# Patient Record
Sex: Female | Born: 1995 | Race: Black or African American | Hispanic: No | Marital: Single | Smoking: Never smoker
Health system: Southern US, Community
[De-identification: ages and names within clinical notes are randomized; demographics above are authoritative.]

---
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
## 2022-11-03 ENCOUNTER — Other Ambulatory Visit: Payer: Self-pay

## 2022-11-03 ENCOUNTER — Emergency Department (HOSPITAL_COMMUNITY)
Admission: EM | Admit: 2022-11-03 | Discharge: 2022-11-03 | Disposition: A | Discharge status: Home / Self Care | Payer: Self-pay

## 2022-11-03 ENCOUNTER — Encounter (HOSPITAL_COMMUNITY): Payer: Self-pay

## 2022-11-03 DIAGNOSIS — M25532 Pain in left wrist: Secondary | ICD-10-CM | POA: Diagnosis present
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
## 2022-11-03 NOTE — Discharge Instructions (Signed)
You have been seen today for your complaint of left wrist pain. Follow up with: Dr. Izora Ribas.  He is a Hydrographic surveyor.  Call to schedule a follow-up appointment. Please seek immediate medical care if you develop any of the following symptoms: You lose feeling in your fingers or hand. Your fingers turn white, very red, or cold and blue. You cannot move your fingers. At this time there does not appear to be the presence of an emergent medical condition, however there is always the potential for conditions to change. Please read and follow the below instructions.  Do not take your medicine if  develop an itchy rash, swelling in your mouth or lips, or difficulty breathing; call 911 and seek immediate emergency medical attention if this occurs.  You may review your lab tests and imaging results in their entirety on your MyChart account.  Please discuss all results of fully with your primary care provider and other specialist at your follow-up visit.  Note: Portions of this text may have been transcribed using voice recognition software. Every effort was made to ensure accuracy; however, inadvertent computerized transcription errors may still be present.

## 2022-12-25 ENCOUNTER — Emergency Department (HOSPITAL_BASED_OUTPATIENT_CLINIC_OR_DEPARTMENT_OTHER)
Admission: EM | Admit: 2022-12-25 | Discharge: 2022-12-26 | Disposition: A | Discharge status: Home / Self Care | Payer: Self-pay | Attending: Emergency Medicine | Admitting: Emergency Medicine

## 2022-12-25 ENCOUNTER — Encounter (HOSPITAL_BASED_OUTPATIENT_CLINIC_OR_DEPARTMENT_OTHER): Payer: Self-pay

## 2022-12-25 ENCOUNTER — Other Ambulatory Visit: Payer: Self-pay

## 2022-12-25 DIAGNOSIS — R1084 Generalized abdominal pain: Secondary | ICD-10-CM | POA: Insufficient documentation

## 2022-12-25 DIAGNOSIS — R109 Unspecified abdominal pain: Secondary | ICD-10-CM

## 2022-12-25 DIAGNOSIS — R112 Nausea with vomiting, unspecified: Secondary | ICD-10-CM | POA: Insufficient documentation

## 2022-12-25 DIAGNOSIS — R519 Headache, unspecified: Secondary | ICD-10-CM | POA: Insufficient documentation

## 2022-12-25 LAB — CBC
HCT: 37.8 % (ref 36.0–46.0)
Hemoglobin: 12.2 g/dL (ref 12.0–15.0)
MCH: 26.9 pg (ref 26.0–34.0)
MCHC: 32.3 g/dL (ref 30.0–36.0)
MCV: 83.3 fL (ref 80.0–100.0)
Platelets: 237 10*3/uL (ref 150–400)
RBC: 4.54 MIL/uL (ref 3.87–5.11)
RDW: 14.7 % (ref 11.5–15.5)
WBC: 5.2 10*3/uL (ref 4.0–10.5)
nRBC: 0 % (ref 0.0–0.2)

## 2022-12-25 LAB — URINALYSIS, ROUTINE W REFLEX MICROSCOPIC
Bilirubin Urine: NEGATIVE
Glucose, UA: NEGATIVE mg/dL
Hgb urine dipstick: NEGATIVE
Ketones, ur: NEGATIVE mg/dL
Leukocytes,Ua: NEGATIVE
Nitrite: NEGATIVE
Specific Gravity, Urine: 1.021 (ref 1.005–1.030)
pH: 8.5 — ABNORMAL HIGH (ref 5.0–8.0)

## 2022-12-25 LAB — COMPREHENSIVE METABOLIC PANEL
ALT: 7 U/L (ref 0–44)
AST: 11 U/L — ABNORMAL LOW (ref 15–41)
Albumin: 4.4 g/dL (ref 3.5–5.0)
Alkaline Phosphatase: 41 U/L (ref 38–126)
Anion gap: 8 (ref 5–15)
BUN: 9 mg/dL (ref 6–20)
CO2: 25 mmol/L (ref 22–32)
Calcium: 9.4 mg/dL (ref 8.9–10.3)
Chloride: 105 mmol/L (ref 98–111)
Creatinine, Ser: 0.6 mg/dL (ref 0.44–1.00)
GFR, Estimated: >60 mL/min (ref >60)
Glucose, Bld: 99 mg/dL (ref 70–99)
Potassium: 4 mmol/L (ref 3.5–5.1)
Sodium: 138 mmol/L (ref 135–145)
Total Bilirubin: 0.4 mg/dL (ref <1.2)
Total Protein: 7.8 g/dL (ref 6.5–8.1)

## 2022-12-25 LAB — PREGNANCY, URINE: Preg Test, Ur: NEGATIVE

## 2022-12-25 LAB — LIPASE, BLOOD: Lipase: 15 U/L (ref 11–51)

## 2022-12-25 MED ORDER — KETOROLAC TROMETHAMINE 60 MG/2ML IM SOLN
60.0000 mg | Freq: Once | INTRAMUSCULAR | Status: AC
  Administered 2022-12-25: 60 mg via INTRAMUSCULAR
  Filled 2022-12-25: qty 2

## 2022-12-25 NOTE — ED Provider Notes (Signed)
[place] [facility]ER[facility]ENC[facility] DEPARTMENT AT [facility] Provider Note   CSN: [id number] Arrival date & time: 12/25/22  1953     History  Chief Complaint  Patient presents with   Abdominal Pain    Angela Richards is a 27 y.o. female.  The history is provided by the patient.  Abdominal Pain She complains of generalized abdominal cramping over the last 2 weeks.  She thought it may have been related to her menses which started on 12/5, but menses ended at 12/10 and the cramping is continued.  Cramping is more than what she typically has with her menses.  There has been some nausea and she did vomit once.  She has also been having some intermittent headaches.  She denies fever or chills.  She denies constipation or diarrhea.  She denies any urinary difficulty.  Denies any vaginal discharge.  She did try taking acetaminophen which gave some temporary relief.   Home Medications Prior to Admission medications   Medication Sig Start Date End Date Taking? Authorizing Provider  oxyCODONE (OXY IR/ROXICODONE) 5 MG immediate release tablet Take 1-2 tablets (5-10 mg total) by mouth every 3 (three) hours as needed for breakthrough pain. 07/26/16   Porterfield, Amber, PA-C      Allergies    Coconut fatty acid    Review of Systems   Review of Systems  Gastrointestinal:  Positive for abdominal pain.  All other systems reviewed and are negative.   Physical Exam Updated Vital Signs BP 130/85   Pulse 87   Temp 98.4 F (36.9 C)   Resp 18   Ht 5\' 2"  (1.575 m)   Wt 78 kg   LMP 12/15/2022 (Exact Date)   SpO2 100%   BMI 31.45 kg/m  Physical Exam Vitals and nursing note reviewed.   27 year old female, resting comfortably and in no acute distress. Vital signs are normal. Oxygen saturation is 100%, which is normal. Head is normocephalic and atraumatic. PERRLA, EOMI. Oropharynx is clear. Neck is nontender and supple without adenopathy. Lungs are clear without rales, wheezes, or rhonchi. Chest  is nontender. Heart has regular rate and rhythm without murmur. Abdomen is soft, flat, nontender. Extremities have no cyanosis or edema, full range of motion is present. Skin is warm and dry without rash. Neurologic: Mental status is normal, moves all extremities equally.  ED Results / Procedures / Treatments   Labs (all labs ordered are listed, but only abnormal results are displayed) Labs Reviewed  COMPREHENSIVE METABOLIC PANEL - Abnormal; Notable for the following components:      Result Value   AST 11 (*)    All other components within normal limits  URINALYSIS, ROUTINE W REFLEX MICROSCOPIC - Abnormal; Notable for the following components:   pH 8.5 (*)    Protein, ur TRACE (*)    All other components within normal limits  LIPASE, BLOOD  CBC  PREGNANCY, URINE    EKG None  Radiology No results found.  Procedures Procedures    Medications Ordered in ED Medications  ketorolac (TORADOL) injection 60 mg (60 mg Intramuscular Given 12/25/22 2335)    ED Course/ Medical Decision Making/ A&P                                 Medical Decision Making Amount and/or Complexity of Data Reviewed Labs: ordered.  Risk Prescription drug management.   Abdominal cramping with benign exam.  Doubt serious pathology such  as appendicitis, diverticulitis, cholecystitis, pancreatitis.  Possible irritable bowel syndrome.  I have reviewed her laboratory test, my interpretation is normal comprehensive metabolic panel, normal lipase, normal CBC with normal WBC, normal urinalysis.  I am ordering a therapeutic trial of intramuscular ketorolac.  She feels much better following above-noted treatment.  I have advised her to use over-the-counter ibuprofen or naproxen as needed for pain, add acetaminophen as needed.  Return if she has new or concerning symptoms.  Final Clinical Impression(s) / ED Diagnoses Final diagnoses:  Abdominal cramping    Rx / DC Orders ED Discharge Orders     None          Dione Booze, MD 12/26/22 0005

## 2022-12-25 NOTE — ED Triage Notes (Signed)
Pt c/o "real bad cramps, HA as well x2wks." First assumed it was r/t menstruation, but no longer "cause it's still happening." Advises fatigue, "sleeping a lot."

## 2022-12-26 NOTE — Discharge Instructions (Addendum)
Take ibuprofen or naproxen as needed for pain.  If you need additional pain relief, add acetaminophen.  Return to the emergency department if your symptoms are getting worse.

## 2023-01-10 ENCOUNTER — Encounter (HOSPITAL_BASED_OUTPATIENT_CLINIC_OR_DEPARTMENT_OTHER): Payer: Self-pay | Admitting: Emergency Medicine

## 2023-01-10 ENCOUNTER — Other Ambulatory Visit: Payer: Self-pay

## 2023-01-10 ENCOUNTER — Emergency Department (HOSPITAL_BASED_OUTPATIENT_CLINIC_OR_DEPARTMENT_OTHER)
Admission: EM | Admit: 2023-01-10 | Discharge: 2023-01-10 | Disposition: A | Discharge status: Home / Self Care | Payer: Self-pay | Attending: Emergency Medicine | Admitting: Emergency Medicine

## 2023-01-10 DIAGNOSIS — B9689 Other specified bacterial agents as the cause of diseases classified elsewhere: Secondary | ICD-10-CM | POA: Insufficient documentation

## 2023-01-10 DIAGNOSIS — A749 Chlamydial infection, unspecified: Secondary | ICD-10-CM

## 2023-01-10 DIAGNOSIS — N760 Acute vaginitis: Secondary | ICD-10-CM | POA: Diagnosis not present

## 2023-01-10 DIAGNOSIS — R519 Headache, unspecified: Secondary | ICD-10-CM | POA: Insufficient documentation

## 2023-01-10 DIAGNOSIS — N3000 Acute cystitis without hematuria: Secondary | ICD-10-CM | POA: Insufficient documentation

## 2023-01-10 LAB — CBC WITH DIFFERENTIAL/PLATELET
Abs Immature Granulocytes: 0.01 10*3/uL (ref 0.00–0.07)
Basophils Absolute: 0 10*3/uL (ref 0.0–0.1)
Basophils Relative: 1 %
Eosinophils Absolute: 0.1 10*3/uL (ref 0.0–0.5)
Eosinophils Relative: 2 %
HCT: 35.6 % — ABNORMAL LOW (ref 36.0–46.0)
Hemoglobin: 11.7 g/dL — ABNORMAL LOW (ref 12.0–15.0)
Immature Granulocytes: 0 %
Lymphocytes Relative: 34 %
Lymphs Abs: 1.9 10*3/uL (ref 0.7–4.0)
MCH: 27.2 pg (ref 26.0–34.0)
MCHC: 32.9 g/dL (ref 30.0–36.0)
MCV: 82.8 fL (ref 80.0–100.0)
Monocytes Absolute: 0.5 10*3/uL (ref 0.1–1.0)
Monocytes Relative: 10 %
Neutro Abs: 3 10*3/uL (ref 1.7–7.7)
Neutrophils Relative %: 53 %
Platelets: 198 10*3/uL (ref 150–400)
RBC: 4.3 MIL/uL (ref 3.87–5.11)
RDW: 14.4 % (ref 11.5–15.5)
WBC: 5.5 10*3/uL (ref 4.0–10.5)
nRBC: 0 % (ref 0.0–0.2)

## 2023-01-10 LAB — URINALYSIS, ROUTINE W REFLEX MICROSCOPIC
Bilirubin Urine: NEGATIVE
Glucose, UA: NEGATIVE mg/dL
Hgb urine dipstick: NEGATIVE
Ketones, ur: NEGATIVE mg/dL
Nitrite: NEGATIVE
Specific Gravity, Urine: 1.03 (ref 1.005–1.030)
pH: 7 (ref 5.0–8.0)

## 2023-01-10 LAB — COMPREHENSIVE METABOLIC PANEL
ALT: 7 U/L (ref 0–44)
AST: 10 U/L — ABNORMAL LOW (ref 15–41)
Albumin: 4.1 g/dL (ref 3.5–5.0)
Alkaline Phosphatase: 37 U/L — ABNORMAL LOW (ref 38–126)
Anion gap: 6 (ref 5–15)
BUN: 11 mg/dL (ref 6–20)
CO2: 26 mmol/L (ref 22–32)
Calcium: 8.7 mg/dL — ABNORMAL LOW (ref 8.9–10.3)
Chloride: 105 mmol/L (ref 98–111)
Creatinine, Ser: 0.66 mg/dL (ref 0.44–1.00)
GFR, Estimated: >60 mL/min (ref >60)
Glucose, Bld: 94 mg/dL (ref 70–99)
Potassium: 3.9 mmol/L (ref 3.5–5.1)
Sodium: 137 mmol/L (ref 135–145)
Total Bilirubin: 0.3 mg/dL (ref 0.0–1.2)
Total Protein: 7.3 g/dL (ref 6.5–8.1)

## 2023-01-10 LAB — WET PREP, GENITAL
Sperm: NONE SEEN
Trich, Wet Prep: NONE SEEN
WBC, Wet Prep HPF POC: <10 (ref <10)
Yeast Wet Prep HPF POC: NONE SEEN

## 2023-01-10 LAB — PREGNANCY, URINE: Preg Test, Ur: NEGATIVE

## 2023-01-10 LAB — LIPASE, BLOOD: Lipase: 12 U/L (ref 11–51)

## 2023-01-10 MED ORDER — KETOROLAC TROMETHAMINE 30 MG/ML IJ SOLN
30.0000 mg | Freq: Once | INTRAMUSCULAR | Status: AC
  Administered 2023-01-10: 30 mg via INTRAMUSCULAR
  Filled 2023-01-10: qty 1

## 2023-01-10 MED ORDER — METRONIDAZOLE 500 MG PO TABS
500.0000 mg | ORAL_TABLET | Freq: Once | ORAL | Status: AC
  Administered 2023-01-10: 500 mg via ORAL
  Filled 2023-01-10: qty 1

## 2023-01-10 MED ORDER — CEFPODOXIME PROXETIL 200 MG PO TABS: 200.0000 mg | ORAL_TABLET | Freq: Two times a day (BID) | ORAL | 0 refills | Status: AC
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
## 2023-01-10 NOTE — ED Provider Notes (Signed)
 [place] EMERGENCY DEPARTMENT AT [facility] Provider Note   CSN: [id number] Arrival date & time: 01/10/23  1843    History  Chief Complaint  Patient presents with   Headache   Back Pain    Angela Richards is a 28 y.o. female here for evaluation of headache and requesting STD screening.  Patient states she has had intermittent headaches over the last month.  She was seen on 12/20 for abdominal pain and headache.  She received a shot of Toradol  which helped her headache.  She denies any symptoms of headache.  Headaches alternate locations in her head.  No vision changes, nausea, vomiting, neck pain, fever, chest pain, shortness of breath, numbness or weakness.  She is also requesting STD testing.  States she has abnormal sensations after urinating.  She denies any vaginal discharge, abdominal pain.  No change in bowel movements.  Denies chance of pregnancy.  HPI     Home Medications Prior to Admission medications   Medication Sig Start Date End Date Taking? Authorizing Provider  cefpodoxime  (VANTIN ) 200 MG tablet Take 1 tablet (200 mg total) by mouth 2 (two) times daily for 5 days. 01/10/23 01/15/23 Yes Negan Grudzien A, PA-C      Allergies    Coconut fatty acid    Review of Systems   Review of Systems  Constitutional: Negative.   HENT: Negative.    Respiratory: Negative.    Cardiovascular: Negative.   Gastrointestinal: Negative.   Genitourinary:  Positive for dysuria. Negative for decreased urine volume, difficulty urinating, dyspareunia, enuresis, flank pain, frequency, genital sores, hematuria, menstrual problem, pelvic pain, urgency, vaginal bleeding, vaginal discharge and vaginal pain.  Musculoskeletal: Negative.   Skin: Negative.   Neurological:  Positive for headaches.  All other systems reviewed and are negative.   Physical Exam Updated Vital Signs BP 132/80 (BP Location: Left Arm)   Pulse 90   Temp 98.3 F (36.8 C) (Oral)   Resp 16   LMP 12/15/2022  (Exact Date)   SpO2 100%  Physical Exam Physical Exam  Constitutional: Pt is oriented to person, place, and time. Pt appears well-developed and well-nourished. No distress.  HENT:  Head: Normocephalic and atraumatic.  Mouth/Throat: Oropharynx is clear and moist.  Eyes: Conjunctivae and EOM are normal. Pupils are equal, round, and reactive to light. No scleral icterus.  No horizontal, vertical or rotational nystagmus  Neck: Normal range of motion. Neck supple.  Full active and passive ROM without pain No midline or paraspinal tenderness No nuchal rigidity or meningeal signs  Cardiovascular: Normal rate, regular rhythm and intact distal pulses.   Pulmonary/Chest: Effort normal and breath sounds normal. No respiratory distress. Pt has no wheezes. No rales.  Abdominal: Soft. Bowel sounds are normal. There is no tenderness. There is no rebound and no guarding.  Musculoskeletal: Normal range of motion.  Lymphadenopathy:    No cervical adenopathy.  Neurological: Pt. is alert and oriented to person, place, and time. He has normal reflexes. No cranial nerve deficit.  Exhibits normal muscle tone. Coordination normal.  Mental Status:  Alert, oriented, thought content appropriate. Speech fluent without evidence of aphasia. Able to follow 2 step commands without difficulty.  Cranial Nerves:  Cn 2-12 grossly intact Motor:  Equal strength Sensory:intact sensation Cerebellar: normal F2N Gait: normal gait and balance CV: distal pulses palpable throughout   Skin: Skin is warm and dry. No rash noted. Pt is not diaphoretic.  Psychiatric: Pt has a normal mood and affect. Behavior is normal. Judgment  and thought content normal.  Nursing note and vitals reviewed.  ED Results / Procedures / Treatments   Labs (all labs ordered are listed, but only abnormal results are displayed) Labs Reviewed  WET PREP, GENITAL - Abnormal; Notable for the following components:      Result Value   Clue Cells Wet Prep  HPF POC PRESENT (*)    All other components within normal limits  CBC WITH DIFFERENTIAL/PLATELET - Abnormal; Notable for the following components:   Hemoglobin 11.7 (*)    HCT 35.6 (*)    All other components within normal limits  COMPREHENSIVE METABOLIC PANEL - Abnormal; Notable for the following components:   Calcium 8.7 (*)    AST 10 (*)    Alkaline Phosphatase 37 (*)    All other components within normal limits  URINALYSIS, ROUTINE W REFLEX MICROSCOPIC - Abnormal; Notable for the following components:   APPearance HAZY (*)    Protein, ur TRACE (*)    Leukocytes,Ua MODERATE (*)    Bacteria, UA RARE (*)    All other components within normal limits  LIPASE, BLOOD  PREGNANCY, URINE  HIV ANTIBODY (ROUTINE TESTING W REFLEX)  RPR  GC/CHLAMYDIA PROBE AMP ([place]) NOT AT [facility]    EKG None  Radiology No results found.  Procedures Procedures    Medications Ordered in ED Medications  ketorolac  (TORADOL ) 30 MG/ML injection 30 mg (30 mg Intramuscular Given 01/10/23 2038)  metroNIDAZOLE  (FLAGYL ) tablet 500 mg (500 mg Oral Given 01/10/23 2037)    ED Course/ Medical Decision Making/ A&P   28 year old here provide for multiple complaints.  Headache intermittently over the last month.  Alternates and locations.  No recent head trauma.  No sudden onset headache.  She is nonfocal neuroexam without deficit.  No recent LP procedures or spinal manipulation.  She has no neck stiffness neck rigidity.  No fever, URI symptoms.  She is also requesting STD testing.  Reports an odd sensation after urinating.  Denies any hematuria, flank pain, chance of pregnancy.  We discussed labs and imaging.  She is okay with labs, she does not want head CT.  I discussed risk versus benefit to include missed mass, bleed, IIH changes however she declines imaging  Labs personally viewed and interpreted:  CBC without leukocytosis Metabolic panel without significant abnormality Lipase 12 Urine moderate leuks,  11-20 WBC, will treat for UTI Wet prep with BV Pregnancy test negative  Discussed results with patient.  She is requesting IM shot of Toradol  as she states this helped with her headaches previously.  Will give.  Will also start on Flagyl .  Discussed not drinking alcohol.  She be DC home with Vantin  for UTI.  The patient has been appropriately medically screened and/or stabilized in the ED. I have low suspicion for any other emergent medical condition which would require further screening, evaluation or treatment in the ED or require inpatient management.  Patient is hemodynamically stable and in no acute distress.  Patient able to ambulate in department prior to ED.  Evaluation does not show acute pathology that would require ongoing or additional emergent interventions while in the emergency department or further inpatient treatment.  I have discussed the diagnosis with the patient and answered all questions.  Pain is been managed while in the emergency department and patient has no further complaints prior to discharge.  Patient is comfortable with plan discussed in room and is stable for discharge at this time.  I have discussed strict return precautions for  returning to the emergency department.  Patient was encouraged to follow-up with PCP/specialist refer to at discharge.                                Medical Decision Making Amount and/or Complexity of Data Reviewed External Data Reviewed: labs, radiology and notes. Labs: ordered. Decision-making details documented in ED Course.  Risk OTC drugs. Prescription drug management. Parenteral controlled substances. Decision regarding hospitalization. Diagnosis or treatment significantly limited by social determinants of health.          Final Clinical Impression(s) / ED Diagnoses Final diagnoses:  Bacterial vaginosis  Acute nonintractable headache, unspecified headache type  Acute cystitis without hematuria    Rx / DC Orders ED  Discharge Orders          Ordered    cefpodoxime  (VANTIN ) 200 MG tablet  2 times daily        01/10/23 2050              Niyla Marone A, PA-C 01/10/23 2120    Ruthe Cornet, DO 01/10/23 2129

## 2023-01-10 NOTE — Discharge Instructions (Addendum)
 Your swab was positive for bacterial vaginosis.  We are starting a medication called Flagyl .  She did drink alcohol while taking this medication  Your urine did show a possible urinary tract infection we are treating this with antibiotics  We talked about your chronic daily headaches.  We discussed a CT scan which she declined.  I have given you a Toradol  shot.  Make sure establish care with a primary care provider

## 2023-01-11 LAB — RPR: RPR Ser Ql: NONREACTIVE

## 2023-01-11 LAB — GC/CHLAMYDIA PROBE AMP (~~LOC~~) NOT AT ARMC
Chlamydia: POSITIVE — AB
Comment: NEGATIVE
Comment: NORMAL
Neisseria Gonorrhea: NEGATIVE

## 2023-01-11 LAB — HIV ANTIBODY (ROUTINE TESTING W REFLEX): HIV Screen 4th Generation wRfx: NONREACTIVE

## 2023-01-12 ENCOUNTER — Telehealth (HOSPITAL_BASED_OUTPATIENT_CLINIC_OR_DEPARTMENT_OTHER): Payer: Self-pay | Admitting: Emergency Medicine

## 2023-01-12 MED ORDER — DOXYCYCLINE HYCLATE 100 MG PO CAPS: 100.0000 mg | ORAL_CAPSULE | Freq: Two times a day (BID) | ORAL | 0 refills | Status: AC

## 2023-01-12 NOTE — Telephone Encounter (Cosign Needed)
 Patient called in requesting treatment for chlamydia after seeing a positive chlamydia finding on GC/chlamydia which was collected on 01/10/2023.  Note from 01/10/2023 reviewed showing patient endorsed some abnormal sensations after urination. Patient is not pregnant. Was not treated empirically. Will start patient on doxycycline  100mg  BID x7 days for this. Please see documentation from 01/10/2023 for any further details.

## 2023-01-21 ENCOUNTER — Telehealth: Payer: Self-pay | Admitting: Physician Assistant

## 2023-01-21 DIAGNOSIS — G44209 Tension-type headache, unspecified, not intractable: Secondary | ICD-10-CM

## 2023-01-21 MED ORDER — KETOROLAC TROMETHAMINE 10 MG PO TABS: 10.0000 mg | ORAL_TABLET | Freq: Four times a day (QID) | ORAL | 0 refills | Status: AC | PRN

## 2023-01-21 NOTE — Patient Instructions (Signed)
Angela Richards, thank you for joining [name], PA-C for today's virtual visit.  While this provider is not your primary care provider (PCP), if your PCP is located in our provider database this encounter information will be shared with them immediately following your visit.   A [facility] MyChart account gives you access to today's visit and all your visits, tests, and labs performed at [facility] " click here if you don't have a [facility] MyChart account or go to [email or web address]  Consent: (Patient) Angela Richards provided verbal consent for this virtual visit at the beginning of the encounter.  Current Medications:  Current Outpatient Medications:    ketorolac (TORADOL) 10 MG tablet, Take 1 tablet (10 mg total) by mouth every 6 (six) hours as needed., Disp: 20 tablet, Rfl: 0   Medications ordered in this encounter:  Meds ordered this encounter  Medications   ketorolac (TORADOL) 10 MG tablet    Sig: Take 1 tablet (10 mg total) by mouth every 6 (six) hours as needed.    Dispense:  20 tablet    Refill:  0    Supervising Provider:   Merrilee Jansky [[id number]]     *If you need refills on other medications prior to your next appointment, please contact your pharmacy*  Follow-Up: Call back or seek an in-person evaluation if the symptoms worsen or if the condition fails to improve as anticipated.  [facility] [phone]  Other Instructions General Headache Without Cause A headache is pain or discomfort felt around the head or neck area. There are many causes and types of headaches. A few common types include: Tension headaches. Migraine headaches. Cluster headaches. Chronic daily headaches. Sometimes, the specific cause of a headache may not be found. Follow these instructions at home: Watch your condition for any changes. Let your health care provider know about them. Take these steps to help with your condition: Managing  pain     Take over-the-counter and prescription medicines only as told by your health care provider. Treatment may include medicines for pain that are taken by mouth or applied to the skin. Lie down in a dark, quiet room when you have a headache. Keep lights dim if bright lights bother you or make your headaches worse. If directed, put ice on your head and neck area: Put ice in a plastic bag. Place a towel between your skin and the bag. Leave the ice on for 20 minutes, 2-3 times per day. Remove the ice if your skin turns bright red. This is very important. If you cannot feel pain, heat, or cold, you have a greater risk of damage to the area. If directed, apply heat to the affected area. Use the heat source that your health care provider recommends, such as a moist heat pack or a heating pad. Place a towel between your skin and the heat source. Leave the heat on for 20-30 minutes. Remove the heat if your skin turns bright red. This is especially important if you are unable to feel pain, heat, or cold. You have a greater risk of getting burned. Eating and drinking Eat meals on a regular schedule. If you drink alcohol: Limit how much you have to: 0-1 drink a day for women who are not pregnant. 0-2 drinks a day for men. Know how much alcohol is in a drink. In the [place] one drink equals one 12 oz bottle of beer (355 mL), one 5 oz glass of wine (148 mL), or  one 1 oz glass of hard liquor (44 mL). Stop drinking caffeine, or decrease the amount of caffeine you drink. Drink enough fluid to keep your urine pale yellow. General instructions  Keep a headache journal to help find out what may trigger your headaches. For example, write down: What you eat and drink. How much sleep you get. Any change to your diet or medicines. Try massage or other relaxation techniques. Limit stress. Sit up straight, and do not tense your muscles. Do not use any products that contain nicotine or tobacco. These  products include cigarettes, chewing tobacco, and vaping devices, such as e-cigarettes. If you need help quitting, ask your health care provider. Exercise regularly as told by your health care provider. Sleep on a regular schedule. Get 7-9 hours of sleep each night, or the amount recommended by your health care provider. Keep all follow-up visits. This is important. Contact a health care provider if: Medicine does not help your symptoms. You have a headache that is different from your usual headache. You have nausea or you vomit. You have a fever. Get help right away if: Your headache: Becomes severe quickly. Gets worse after moderate to intense physical activity. You have any of these symptoms: Repeated vomiting. Pain or stiffness in your neck. Changes to your vision. Pain in an eye or ear. Problems with speech. Muscular weakness or loss of muscle control. Loss of balance or coordination. You feel faint or pass out. You have confusion. You have a seizure. These symptoms may represent a serious problem that is an emergency. Do not wait to see if the symptoms will go away. Get medical help right away. Call your local emergency services (911 in the [place] Do not drive yourself to the hospital. Summary A headache is pain or discomfort felt around the head or neck area. There are many causes and types of headaches. In some cases, the cause may not be found. Keep a headache journal to help find out what may trigger your headaches. Watch your condition for any changes. Let your health care provider know about them. Contact a health care provider if you have a headache that is different from the usual headache, or if your symptoms are not helped by medicine. Get help right away if your headache becomes severe, you vomit, you have a loss of vision, you lose your balance, or you have a seizure. This information is not intended to replace advice given to you by your health care provider. Make  sure you discuss any questions you have with your health care provider. Document Revised: 05/22/2020 Document Reviewed: 05/22/2020 Elsevier Patient Education  2024 Elsevier Inc.   If you have been instructed to have an in-person evaluation today at a local Urgent Care facility, please use the link below. It will take you to a list of all of our available [facility], including address, phone number and hours of operation. Please do not delay care.  [facility]  If you or a family member do not have a primary care provider, use the link below to schedule a visit and establish care. When you choose a [facility] primary care physician or advanced practice provider, you gain a long-term partner in health. Find a Primary Care Provider  Learn more about [facility]'s in-office and virtual care options:  - Get Care Now
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
## 2023-01-27 ENCOUNTER — Encounter: Payer: Self-pay | Admitting: Physician Assistant
